# Patient Record
Sex: Male | Born: 1995 | Race: Black or African American | Hispanic: No | Marital: Single | Smoking: Never smoker
Health system: Southern US, Community
[De-identification: ages and names within clinical notes are randomized; demographics above are authoritative.]

## PROBLEM LIST (undated history)

## (undated) DIAGNOSIS — S82101A Unspecified fracture of upper end of right tibia, initial encounter for closed fracture: Secondary | ICD-10-CM

---
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
## 2016-04-19 ENCOUNTER — Emergency Department (HOSPITAL_COMMUNITY)
Admission: EM | Admit: 2016-04-19 | Discharge: 2016-04-20 | Disposition: A | Discharge status: Home / Self Care | Payer: BLUE CROSS/BLUE SHIELD | Attending: Emergency Medicine | Admitting: Emergency Medicine

## 2016-04-19 ENCOUNTER — Encounter (HOSPITAL_COMMUNITY): Payer: Self-pay

## 2016-04-19 DIAGNOSIS — R1084 Generalized abdominal pain: Secondary | ICD-10-CM | POA: Diagnosis present

## 2016-04-19 DIAGNOSIS — Z8781 Personal history of (healed) traumatic fracture: Secondary | ICD-10-CM | POA: Diagnosis not present

## 2016-04-19 DIAGNOSIS — K529 Noninfective gastroenteritis and colitis, unspecified: Secondary | ICD-10-CM

## 2016-04-19 DIAGNOSIS — J45909 Unspecified asthma, uncomplicated: Secondary | ICD-10-CM | POA: Insufficient documentation

## 2016-04-19 DIAGNOSIS — Z79899 Other long term (current) drug therapy: Secondary | ICD-10-CM | POA: Insufficient documentation

## 2016-04-19 DIAGNOSIS — R1031 Right lower quadrant pain: Secondary | ICD-10-CM

## 2016-04-19 LAB — URINALYSIS, ROUTINE W REFLEX MICROSCOPIC
Glucose, UA: NEGATIVE mg/dL
HGB URINE DIPSTICK: NEGATIVE
Leukocytes, UA: NEGATIVE
NITRITE: NEGATIVE
Protein, ur: NEGATIVE mg/dL
SPECIFIC GRAVITY, URINE: 1.031 — AB (ref 1.005–1.030)
pH: 6 (ref 5.0–8.0)

## 2016-04-19 LAB — CBC
HCT: 43.6 % (ref 39.0–52.0)
HEMOGLOBIN: 14.6 g/dL (ref 13.0–17.0)
MCH: 30.2 pg (ref 26.0–34.0)
MCHC: 33.5 g/dL (ref 30.0–36.0)
MCV: 90.3 fL (ref 78.0–100.0)
PLATELETS: 337 10*3/uL (ref 150–400)
RBC: 4.83 MIL/uL (ref 4.22–5.81)
RDW: 12.8 % (ref 11.5–15.5)
WBC: 9.4 10*3/uL (ref 4.0–10.5)

## 2016-04-19 LAB — COMPREHENSIVE METABOLIC PANEL
ALBUMIN: 4.5 g/dL (ref 3.5–5.0)
ALK PHOS: 53 U/L (ref 38–126)
ALT: 21 U/L (ref 17–63)
ANION GAP: 14 (ref 5–15)
AST: 19 U/L (ref 15–41)
BILIRUBIN TOTAL: 1.1 mg/dL (ref 0.3–1.2)
BUN: 14 mg/dL (ref 6–20)
CALCIUM: 9.8 mg/dL (ref 8.9–10.3)
CO2: 20 mmol/L — AB (ref 22–32)
CREATININE: 0.93 mg/dL (ref 0.61–1.24)
Chloride: 104 mmol/L (ref 101–111)
GFR calc Af Amer: >60 mL/min (ref >60)
GFR calc non Af Amer: >60 mL/min (ref >60)
GLUCOSE: 130 mg/dL — AB (ref 65–99)
Potassium: 3.4 mmol/L — ABNORMAL LOW (ref 3.5–5.1)
SODIUM: 138 mmol/L (ref 135–145)
TOTAL PROTEIN: 8.3 g/dL — AB (ref 6.5–8.1)

## 2016-04-19 LAB — LIPASE, BLOOD: Lipase: 22 U/L (ref 11–51)

## 2016-04-19 MED ORDER — ONDANSETRON HCL 4 MG/2ML IJ SOLN
4.0000 mg | Freq: Once | INTRAMUSCULAR | Status: AC
  Administered 2016-04-20: 4 mg via INTRAVENOUS
  Filled 2016-04-19: qty 2

## 2016-04-19 MED ORDER — ACETAMINOPHEN 325 MG PO TABS
650.0000 mg | ORAL_TABLET | Freq: Once | ORAL | Status: AC | PRN
  Administered 2016-04-19: 650 mg via ORAL

## 2016-04-19 MED ORDER — MORPHINE SULFATE (PF) 4 MG/ML IV SOLN
4.0000 mg | Freq: Once | INTRAVENOUS | Status: AC
  Administered 2016-04-20: 4 mg via INTRAVENOUS
  Filled 2016-04-19: qty 1

## 2016-04-19 MED ORDER — ACETAMINOPHEN 325 MG PO TABS
ORAL_TABLET | ORAL | Status: AC
  Filled 2016-04-19: qty 2

## 2016-04-19 NOTE — ED Provider Notes (Signed)
CSN: [id number]     Arrival date & time 04/19/16  2209 History   First MD Initiated Contact with Patient 04/19/16 2314     Chief Complaint  Patient presents with  . Abdominal Pain     (Consider location/radiation/quality/duration/timing/severity/associated sxs/prior Treatment) HPI Comments: Patient presents today with a chief complaint of abdominal pain.  Pain has been constant since this morning.  Pain is diffuse and does not radiate.  He has not taken anything for pain prior to arrival.  He states that he never had pain like this before.  He does have associated nausea and had two episodes of loose stool after arrival in the ED.  He also was found to have a temp of 100.5 F upon arrival in the ED.  He denies vomiting, urinary symptoms, scrotal pain/swelling, or any other symptoms at this time.  No prior abdominal surgeries.    The history is provided by the patient.    Past Medical History  Diagnosis Date  . Asthma   . Closed fracture of right proximal tibia 12/18/2011   Past Surgical History  Procedure Laterality Date  . Orif tibia plateau  12/18/2011    Procedure: OPEN REDUCTION INTERNAL FIXATION (ORIF) TIBIAL PLATEAU;  Surgeon: Eulas PostJoshua P Landau, MD;  Location: [facility] OR;  Service: Orthopedics;  Laterality: Right;  ORIF Right Proximal Tibia Fracture  . Irrigation and debridement knee  12/18/2011    Procedure: IRRIGATION AND DEBRIDEMENT KNEE;  Surgeon: Eulas PostJoshua P Landau, MD;  Location: [facility] OR;  Service: Orthopedics;  Laterality: Right;   Family History  Problem Relation Age of Onset  . Diabetes Mother   . Hyperlipidemia Father   . Hypertension Father    Social History  Substance Use Topics  . Smoking status: Never Smoker   . Smokeless tobacco: Never Used  . Alcohol Use: No    Review of Systems  All other systems reviewed and are negative.     Allergies  Review of patient's allergies indicates no known allergies.  Home Medications   Prior to Admission medications    Medication Sig Start Date End Date Taking? Authorizing Provider  Multiple Vitamin (MULTIVITAMIN WITH MINERALS) TABS tablet Take 1 tablet by mouth daily.   Yes Historical Provider, MD   BP 113/65 mmHg  Pulse 96  Temp(Src) 100.5 F (38.1 C) (Oral)  Resp 18  SpO2 100% Physical Exam  Constitutional: He appears well-developed and well-nourished.  HENT:  Head: Normocephalic and atraumatic.  Mouth/Throat: Oropharynx is clear and moist.  Neck: Normal range of motion. Neck supple.  Cardiovascular: Normal rate, regular rhythm and normal heart sounds.   Pulmonary/Chest: Effort normal and breath sounds normal.  Abdominal: Soft. Bowel sounds are normal. He exhibits no distension and no mass. There is tenderness in the right lower quadrant. There is no rebound and no guarding.  Musculoskeletal: Normal range of motion.  Neurological: He is alert.  Skin: Skin is warm and dry.  Psychiatric: He has a normal mood and affect.  Nursing note and vitals reviewed.   ED Course  Procedures (including critical care time) Labs Review Labs Reviewed  COMPREHENSIVE METABOLIC PANEL - Abnormal; Notable for the following:    Potassium 3.4 (*)    CO2 20 (*)    Glucose, Bld 130 (*)    Total Protein 8.3 (*)    All other components within normal limits  URINALYSIS, ROUTINE W REFLEX MICROSCOPIC (NOT AT [facility]) - Abnormal; Notable for the following:    Specific Gravity, Urine 1.031 (*)  Bilirubin Urine SMALL (*)    Ketones, ur >80 (*)    All other components within normal limits  LIPASE, BLOOD  CBC    Imaging Review No results found. I have personally reviewed and evaluated these images and lab results as part of my medical decision-making.   EKG Interpretation None      MDM   Final diagnoses:  RLQ abdominal pain   Patient presents today with low grade fever and diffuse abdominal pain.  On exam, he has tenderness to palpation of the RLQ.  Labs today are unremarkable.  However, due to the fact  that he has a fever and pain of the RLQ, a CT ab/pelvis was ordered to rule out an Appendicitis.  Patient signed out to Marlon Pel, PA-C at shift change who will follow up on the results of the CT.    Santiago Glad, PA-C 04/21/16 2217  Layla Maw Ward, DO 04/21/16 2310

## 2016-04-19 NOTE — ED Notes (Signed)
Pt states he has had generalized abdominal pain and nausea all day and reports his abdomen feels hard and it hurts to move. LBM yesterday, usually has bowel movement after every meal. Denies urinary abnormalities.

## 2016-04-20 ENCOUNTER — Emergency Department (HOSPITAL_COMMUNITY): Payer: BLUE CROSS/BLUE SHIELD

## 2016-04-20 MED ORDER — IOPAMIDOL (ISOVUE-300) INJECTION 61%
INTRAVENOUS | Status: AC
  Administered 2016-04-20: 100 mL
  Filled 2016-04-20: qty 100

## 2016-04-20 MED ORDER — METRONIDAZOLE 500 MG PO TABS: 500.0000 mg | ORAL_TABLET | Freq: Two times a day (BID) | ORAL | Status: AC

## 2016-04-20 MED ORDER — CIPROFLOXACIN HCL 500 MG PO TABS: 500.0000 mg | ORAL_TABLET | Freq: Two times a day (BID) | ORAL | Status: AC

## 2016-04-20 MED ORDER — HYDROCODONE-ACETAMINOPHEN 5-325 MG PO TABS
1.0000 | ORAL_TABLET | Freq: Once | ORAL | Status: AC
  Administered 2016-04-20: 1 via ORAL
  Filled 2016-04-20: qty 1

## 2016-04-20 MED ORDER — TRAMADOL HCL 50 MG PO TABS: 50.0000 mg | ORAL_TABLET | Freq: Four times a day (QID) | ORAL | Status: AC | PRN

## 2016-04-20 MED ORDER — METRONIDAZOLE 500 MG PO TABS
500.0000 mg | ORAL_TABLET | Freq: Once | ORAL | Status: AC
  Administered 2016-04-20: 500 mg via ORAL
  Filled 2016-04-20: qty 1

## 2016-04-20 MED ORDER — CIPROFLOXACIN HCL 500 MG PO TABS
500.0000 mg | ORAL_TABLET | Freq: Once | ORAL | Status: AC
  Administered 2016-04-20: 500 mg via ORAL
  Filled 2016-04-20: qty 1

## 2016-04-20 MED ORDER — ONDANSETRON HCL 4 MG PO TABS: 4.0000 mg | ORAL_TABLET | Freq: Four times a day (QID) | ORAL | Status: AC

## 2016-04-20 NOTE — ED Notes (Signed)
Patient transported to CT 

## 2016-04-20 NOTE — Discharge Instructions (Signed)

## 2016-04-20 NOTE — ED Provider Notes (Signed)
Patient sign out from Santiago GladHeather Laisure, PA-C at end ofshift.  Patient having abdominal pain, RLQ and nausea and low grade fever. CT abd/pelv pending.  CT scan shows signs of enteritis. Given patient low grade fever and severity of pain will start on empiric abx.  Cipro/flagyl regimine and Zofran for home. discussed results with patient and answered questions  Medications  acetaminophen (TYLENOL) tablet 650 mg (650 mg Oral Given 04/19/16 2226)  morphine 4 MG/ML injection 4 mg (4 mg Intravenous Given 04/20/16 0028)  ondansetron (ZOFRAN) injection 4 mg (4 mg Intravenous Given 04/20/16 0028)  iopamidol (ISOVUE-300) 61 % injection (100 mLs  Contrast Given 04/20/16 0057)    I discussed results, diagnoses and plan with [place] S Schmieg. They voice there understanding and questions were answered. We discussed follow-up recommendations and return precautions.   Marlon Peliffany Curry Seefeldt, PA-C 04/20/16 0154  Layla MawKristen N Ward, DO 04/20/16 [id number]

## 2017-04-12 ENCOUNTER — Emergency Department (HOSPITAL_COMMUNITY): Payer: No Typology Code available for payment source

## 2017-04-12 ENCOUNTER — Emergency Department (HOSPITAL_COMMUNITY)
Admission: EM | Admit: 2017-04-12 | Discharge: 2017-04-12 | Disposition: A | Discharge status: Home / Self Care | Payer: No Typology Code available for payment source | Attending: Emergency Medicine | Admitting: Emergency Medicine

## 2017-04-12 ENCOUNTER — Encounter (HOSPITAL_COMMUNITY): Payer: Self-pay | Admitting: *Deleted

## 2017-04-12 DIAGNOSIS — Y939 Activity, unspecified: Secondary | ICD-10-CM | POA: Diagnosis not present

## 2017-04-12 DIAGNOSIS — S60512A Abrasion of left hand, initial encounter: Secondary | ICD-10-CM

## 2017-04-12 DIAGNOSIS — Y999 Unspecified external cause status: Secondary | ICD-10-CM | POA: Insufficient documentation

## 2017-04-12 DIAGNOSIS — J45909 Unspecified asthma, uncomplicated: Secondary | ICD-10-CM | POA: Insufficient documentation

## 2017-04-12 DIAGNOSIS — Y92410 Unspecified street and highway as the place of occurrence of the external cause: Secondary | ICD-10-CM | POA: Diagnosis not present

## 2017-04-12 DIAGNOSIS — S40022A Contusion of left upper arm, initial encounter: Secondary | ICD-10-CM | POA: Diagnosis not present

## 2017-04-12 DIAGNOSIS — S301XXA Contusion of abdominal wall, initial encounter: Secondary | ICD-10-CM | POA: Diagnosis not present

## 2017-04-12 DIAGNOSIS — Z79899 Other long term (current) drug therapy: Secondary | ICD-10-CM | POA: Insufficient documentation

## 2017-04-12 DIAGNOSIS — S3991XA Unspecified injury of abdomen, initial encounter: Secondary | ICD-10-CM | POA: Diagnosis present

## 2017-04-12 MED ORDER — HYDROCODONE-ACETAMINOPHEN 5-325 MG PO TABS
1.0000 | ORAL_TABLET | Freq: Once | ORAL | Status: AC
  Administered 2017-04-12: 1 via ORAL
  Filled 2017-04-12: qty 1

## 2017-04-12 MED ORDER — IBUPROFEN 600 MG PO TABS: 600.0000 mg | ORAL_TABLET | Freq: Four times a day (QID) | ORAL | 0 refills | Status: AC | PRN

## 2017-04-12 MED ORDER — BACITRACIN ZINC 500 UNIT/GM EX OINT
TOPICAL_OINTMENT | Freq: Two times a day (BID) | CUTANEOUS | Status: DC
Start: 2017-04-12 — End: 2017-04-12
  Administered 2017-04-12: 1 via TOPICAL
  Filled 2017-04-12: qty 1.8

## 2017-04-12 MED ORDER — HYDROCODONE-ACETAMINOPHEN 5-325 MG PO TABS: 1.0000 | ORAL_TABLET | ORAL | 0 refills | Status: AC | PRN

## 2017-04-12 MED ORDER — IOPAMIDOL (ISOVUE-300) INJECTION 61%
100.0000 mL | Freq: Once | INTRAVENOUS | Status: AC | PRN
  Administered 2017-04-12: 100 mL via INTRAVENOUS

## 2017-04-12 MED ORDER — IBUPROFEN 200 MG PO TABS
600.0000 mg | ORAL_TABLET | Freq: Once | ORAL | Status: AC
  Administered 2017-04-12: 600 mg via ORAL
  Filled 2017-04-12: qty 3

## 2017-04-12 MED ORDER — IOPAMIDOL (ISOVUE-300) INJECTION 61%
INTRAVENOUS | Status: AC
  Filled 2017-04-12: qty 100

## 2017-04-12 NOTE — ED Notes (Signed)
Bed: WA05 Expected date:  Expected time:  Means of arrival:  Comments: 

## 2017-04-12 NOTE — ED Notes (Signed)
Dr. Haviland at bedside. 
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
## 2020-05-12 ENCOUNTER — Other Ambulatory Visit (HOSPITAL_COMMUNITY): Payer: Self-pay | Admitting: Internal Medicine

## 2020-10-02 ENCOUNTER — Ambulatory Visit: Payer: PRIVATE HEALTH INSURANCE | Attending: Internal Medicine

## 2020-10-02 DIAGNOSIS — Z23 Encounter for immunization: Secondary | ICD-10-CM

## 2020-10-02 NOTE — Progress Notes (Signed)
   Covid-19 Vaccination Clinic  Name:  Brian [name]    MRN: [id number] DOB: 1996-10-27  10/02/2020  [name] was observed post Covid-19 immunization for 15 minutes without incident. He was provided with Vaccine Information Sheet and instruction to access the V-Safe system.   [name] was instructed to call 911 with any severe reactions post vaccine: Marland Kitchen Difficulty breathing  . Swelling of face and throat  . A fast heartbeat  . A bad rash all over body  . Dizziness and weakness

## 2021-04-02 ENCOUNTER — Other Ambulatory Visit (HOSPITAL_COMMUNITY): Payer: Self-pay

## 2021-04-02 MED FILL — Rosuvastatin Calcium Tab 10 MG: ORAL | 90 days supply | Qty: 90 | Fill #0 | Status: AC

## 2021-05-30 ENCOUNTER — Other Ambulatory Visit (HOSPITAL_COMMUNITY): Payer: Self-pay

## 2021-05-31 ENCOUNTER — Other Ambulatory Visit (HOSPITAL_COMMUNITY): Payer: Self-pay

## 2021-05-31 MED ORDER — ROSUVASTATIN CALCIUM 10 MG PO TABS
ORAL_TABLET | ORAL | 1 refills | Status: DC
  Filled 2021-05-31 – 2021-09-13 (×4): qty 90, 90d supply, fill #0

## 2021-06-06 ENCOUNTER — Other Ambulatory Visit (HOSPITAL_COMMUNITY): Payer: Self-pay

## 2021-06-07 ENCOUNTER — Other Ambulatory Visit (HOSPITAL_COMMUNITY): Payer: Self-pay

## 2021-08-31 ENCOUNTER — Other Ambulatory Visit (HOSPITAL_COMMUNITY): Payer: Self-pay

## 2021-09-03 ENCOUNTER — Other Ambulatory Visit (HOSPITAL_COMMUNITY): Payer: Self-pay

## 2021-09-05 ENCOUNTER — Other Ambulatory Visit (HOSPITAL_COMMUNITY): Payer: Self-pay

## 2021-09-13 ENCOUNTER — Other Ambulatory Visit (HOSPITAL_COMMUNITY): Payer: Self-pay

## 2022-02-25 ENCOUNTER — Other Ambulatory Visit (HOSPITAL_COMMUNITY): Payer: Self-pay

## 2022-02-25 MED ORDER — ROSUVASTATIN CALCIUM 20 MG PO TABS
20.0000 mg | ORAL_TABLET | Freq: Every day | ORAL | 0 refills | Status: DC
  Filled 2022-02-25 – 2022-03-05 (×2): qty 90, 90d supply, fill #0

## 2022-03-05 ENCOUNTER — Other Ambulatory Visit (HOSPITAL_COMMUNITY): Payer: Self-pay

## 2022-05-27 ENCOUNTER — Other Ambulatory Visit (HOSPITAL_COMMUNITY): Payer: Self-pay

## 2022-05-27 MED ORDER — ROSUVASTATIN CALCIUM 20 MG PO TABS
20.0000 mg | ORAL_TABLET | Freq: Every day | ORAL | 0 refills | Status: DC
  Filled 2022-05-27: qty 90, 90d supply, fill #0

## 2022-05-29 ENCOUNTER — Other Ambulatory Visit (HOSPITAL_COMMUNITY): Payer: Self-pay

## 2022-07-18 ENCOUNTER — Other Ambulatory Visit (HOSPITAL_COMMUNITY): Payer: Self-pay

## 2022-12-20 ENCOUNTER — Other Ambulatory Visit: Payer: Self-pay

## 2022-12-20 MED ORDER — ROSUVASTATIN CALCIUM 20 MG PO TABS
20.0000 mg | ORAL_TABLET | Freq: Every day | ORAL | 0 refills | Status: DC
  Filled 2022-12-20 – 2023-01-01 (×2): qty 90, 90d supply, fill #0

## 2022-12-27 ENCOUNTER — Other Ambulatory Visit: Payer: Self-pay

## 2023-01-01 ENCOUNTER — Other Ambulatory Visit: Payer: Self-pay

## 2023-03-25 ENCOUNTER — Other Ambulatory Visit: Payer: Self-pay

## 2023-03-26 ENCOUNTER — Other Ambulatory Visit: Payer: Self-pay

## 2023-03-26 MED ORDER — ROSUVASTATIN CALCIUM 20 MG PO TABS
20.0000 mg | ORAL_TABLET | Freq: Every day | ORAL | 2 refills | Status: AC
  Filled 2023-03-26: qty 90, 90d supply, fill #0
  Filled 2023-09-02: qty 90, 90d supply, fill #1

## 2023-03-31 ENCOUNTER — Other Ambulatory Visit: Payer: Self-pay

## 2023-09-02 ENCOUNTER — Other Ambulatory Visit: Payer: Self-pay

## 2023-09-09 ENCOUNTER — Other Ambulatory Visit: Payer: Self-pay

## 2023-12-24 ENCOUNTER — Other Ambulatory Visit (HOSPITAL_COMMUNITY): Payer: Self-pay

## 2023-12-24 MED ORDER — ROSUVASTATIN CALCIUM 10 MG PO TABS
10.0000 mg | ORAL_TABLET | Freq: Two times a day (BID) | ORAL | 3 refills | Status: AC
  Filled 2023-12-24: qty 180, 90d supply, fill #0
  Filled 2024-10-04: qty 60, 30d supply, fill #0

## 2023-12-26 ENCOUNTER — Other Ambulatory Visit (HOSPITAL_COMMUNITY): Payer: Self-pay

## 2024-01-01 ENCOUNTER — Other Ambulatory Visit (HOSPITAL_COMMUNITY): Payer: Self-pay

## 2024-04-02 ENCOUNTER — Other Ambulatory Visit (HOSPITAL_COMMUNITY): Payer: Self-pay

## 2024-04-02 MED ORDER — ATORVASTATIN CALCIUM 10 MG PO TABS
10.0000 mg | ORAL_TABLET | Freq: Every day | ORAL | 11 refills | Status: AC
  Filled 2024-04-02: qty 30, 30d supply, fill #0
  Filled 2024-04-30: qty 30, 30d supply, fill #1
  Filled 2024-06-04: qty 30, 30d supply, fill #2
  Filled 2024-07-07: qty 30, 30d supply, fill #3
  Filled 2024-08-02: qty 30, 30d supply, fill #4
  Filled 2024-09-13: qty 30, 30d supply, fill #5
  Filled 2024-10-10: qty 30, 30d supply, fill #6

## 2024-06-28 ENCOUNTER — Other Ambulatory Visit (HOSPITAL_COMMUNITY): Payer: Self-pay

## 2024-06-28 MED ORDER — VITAMIN D3 1.25 MG (50000 UT) PO CAPS
50000.0000 [IU] | ORAL_CAPSULE | ORAL | 0 refills | Status: AC
  Filled 2024-06-28: qty 12, 84d supply, fill #0

## 2024-07-26 ENCOUNTER — Other Ambulatory Visit (HOSPITAL_COMMUNITY): Payer: Self-pay

## 2024-07-26 MED ORDER — PHENTERMINE HCL 37.5 MG PO TABS
18.7500 mg | ORAL_TABLET | Freq: Every morning | ORAL | 0 refills | Status: DC
  Filled 2024-07-26: qty 30, 30d supply, fill #0

## 2024-07-28 ENCOUNTER — Other Ambulatory Visit (HOSPITAL_COMMUNITY): Payer: Self-pay

## 2024-08-24 ENCOUNTER — Other Ambulatory Visit (HOSPITAL_COMMUNITY): Payer: Self-pay

## 2024-08-24 MED ORDER — PHENTERMINE HCL 37.5 MG PO TABS
18.7500 mg | ORAL_TABLET | Freq: Every day | ORAL | 0 refills | Status: DC
  Filled 2024-08-24 – 2024-08-25 (×2): qty 30, 30d supply, fill #0

## 2024-08-25 ENCOUNTER — Other Ambulatory Visit (HOSPITAL_COMMUNITY): Payer: Self-pay

## 2024-09-13 ENCOUNTER — Other Ambulatory Visit (HOSPITAL_COMMUNITY): Payer: Self-pay

## 2024-09-13 ENCOUNTER — Other Ambulatory Visit: Payer: Self-pay

## 2024-09-15 ENCOUNTER — Other Ambulatory Visit (HOSPITAL_COMMUNITY): Payer: Self-pay

## 2024-09-15 MED ORDER — PHENTERMINE HCL 37.5 MG PO TABS
17.7500 mg | ORAL_TABLET | Freq: Every day | ORAL | 0 refills | Status: AC
  Filled 2024-10-04: qty 30, 30d supply, fill #0

## 2024-10-04 ENCOUNTER — Other Ambulatory Visit (HOSPITAL_COMMUNITY): Payer: Self-pay

## 2024-10-05 ENCOUNTER — Other Ambulatory Visit (HOSPITAL_COMMUNITY): Payer: Self-pay

## 2024-11-05 ENCOUNTER — Encounter (HOSPITAL_COMMUNITY): Payer: Self-pay

## 2024-11-05 ENCOUNTER — Emergency Department (HOSPITAL_COMMUNITY)

## 2024-11-05 ENCOUNTER — Other Ambulatory Visit: Payer: Self-pay

## 2024-11-05 ENCOUNTER — Emergency Department (HOSPITAL_COMMUNITY)
Admission: EM | Admit: 2024-11-05 | Discharge: 2024-11-06 | Disposition: A | Attending: Emergency Medicine | Admitting: Emergency Medicine

## 2024-11-05 DIAGNOSIS — R519 Headache, unspecified: Secondary | ICD-10-CM | POA: Diagnosis present

## 2024-11-05 DIAGNOSIS — R29898 Other symptoms and signs involving the musculoskeletal system: Secondary | ICD-10-CM | POA: Insufficient documentation

## 2024-11-05 DIAGNOSIS — R791 Abnormal coagulation profile: Secondary | ICD-10-CM | POA: Insufficient documentation

## 2024-11-05 DIAGNOSIS — R799 Abnormal finding of blood chemistry, unspecified: Secondary | ICD-10-CM | POA: Insufficient documentation

## 2024-11-05 DIAGNOSIS — G43809 Other migraine, not intractable, without status migrainosus: Secondary | ICD-10-CM | POA: Insufficient documentation

## 2024-11-05 DIAGNOSIS — R449 Unspecified symptoms and signs involving general sensations and perceptions: Secondary | ICD-10-CM | POA: Insufficient documentation

## 2024-11-05 LAB — COMPREHENSIVE METABOLIC PANEL WITH GFR
ALT: 23 U/L (ref 0–44)
AST: 17 U/L (ref 15–41)
Albumin: 4.3 g/dL (ref 3.5–5.0)
Alkaline Phosphatase: 43 U/L (ref 38–126)
Anion gap: 12 (ref 5–15)
BUN: 11 mg/dL (ref 6–20)
CO2: 22 mmol/L (ref 22–32)
Calcium: 9.3 mg/dL (ref 8.9–10.3)
Chloride: 104 mmol/L (ref 98–111)
Creatinine, Ser: 0.89 mg/dL (ref 0.61–1.24)
GFR, Estimated: >60 mL/min (ref >60)
Glucose, Bld: 102 mg/dL — ABNORMAL HIGH (ref 70–99)
Potassium: 3.6 mmol/L (ref 3.5–5.1)
Sodium: 138 mmol/L (ref 135–145)
Total Bilirubin: 1 mg/dL (ref 0.0–1.2)
Total Protein: 7.8 g/dL (ref 6.5–8.1)

## 2024-11-05 LAB — DIFFERENTIAL
Abs Immature Granulocytes: 0.03 K/uL (ref 0.00–0.07)
Basophils Absolute: 0 K/uL (ref 0.0–0.1)
Basophils Relative: 0 %
Eosinophils Absolute: 0 K/uL (ref 0.0–0.5)
Eosinophils Relative: 0 %
Immature Granulocytes: 0 %
Lymphocytes Relative: 6 %
Lymphs Abs: 0.5 K/uL — ABNORMAL LOW (ref 0.7–4.0)
Monocytes Absolute: 1 K/uL (ref 0.1–1.0)
Monocytes Relative: 10 %
Neutro Abs: 7.7 K/uL (ref 1.7–7.7)
Neutrophils Relative %: 84 %

## 2024-11-05 LAB — I-STAT CHEM 8, ED
BUN: 12 mg/dL (ref 6–20)
Calcium, Ion: 1.07 mmol/L — ABNORMAL LOW (ref 1.15–1.40)
Chloride: 105 mmol/L (ref 98–111)
Creatinine, Ser: 0.9 mg/dL (ref 0.61–1.24)
Glucose, Bld: 102 mg/dL — ABNORMAL HIGH (ref 70–99)
HCT: 45 % (ref 39.0–52.0)
Hemoglobin: 15.3 g/dL (ref 13.0–17.0)
Potassium: 3.6 mmol/L (ref 3.5–5.1)
Sodium: 139 mmol/L (ref 135–145)
TCO2: 21 mmol/L — ABNORMAL LOW (ref 22–32)

## 2024-11-05 LAB — CBC
HCT: 43.3 % (ref 39.0–52.0)
Hemoglobin: 14.2 g/dL (ref 13.0–17.0)
MCH: 30.3 pg (ref 26.0–34.0)
MCHC: 32.8 g/dL (ref 30.0–36.0)
MCV: 92.5 fL (ref 80.0–100.0)
Platelets: 376 K/uL (ref 150–400)
RBC: 4.68 MIL/uL (ref 4.22–5.81)
RDW: 13.5 % (ref 11.5–15.5)
WBC: 9.3 K/uL (ref 4.0–10.5)
nRBC: 0 % (ref 0.0–0.2)

## 2024-11-05 LAB — PROTIME-INR
INR: 1 (ref 0.8–1.2)
Prothrombin Time: 14.1 s (ref 11.4–15.2)

## 2024-11-05 LAB — ETHANOL: Alcohol, Ethyl (B): <15 mg/dL (ref <15)

## 2024-11-05 LAB — CBG MONITORING, ED: Glucose-Capillary: 90 mg/dL (ref 70–99)

## 2024-11-05 LAB — APTT: aPTT: 29 s (ref 24–36)

## 2024-11-05 MED ORDER — PROCHLORPERAZINE EDISYLATE 10 MG/2ML IJ SOLN
10.0000 mg | Freq: Once | INTRAMUSCULAR | Status: AC
  Administered 2024-11-05: 10 mg via INTRAVENOUS
  Filled 2024-11-05: qty 2

## 2024-11-05 MED ORDER — DIPHENHYDRAMINE HCL 50 MG/ML IJ SOLN
50.0000 mg | Freq: Once | INTRAMUSCULAR | Status: AC
  Administered 2024-11-05: 50 mg via INTRAVENOUS
  Filled 2024-11-05: qty 1

## 2024-11-05 MED ORDER — IOHEXOL 350 MG/ML SOLN
100.0000 mL | Freq: Once | INTRAVENOUS | Status: AC | PRN
  Administered 2024-11-05: 100 mL via INTRAVENOUS

## 2024-11-05 MED ORDER — SODIUM CHLORIDE 0.9% FLUSH
3.0000 mL | Freq: Once | INTRAVENOUS | Status: AC
  Administered 2024-11-05: 3 mL via INTRAVENOUS

## 2024-11-05 MED ORDER — LORAZEPAM 1 MG PO TABS
1.0000 mg | ORAL_TABLET | Freq: Once | ORAL | Status: DC
  Filled 2024-11-05: qty 1

## 2024-11-05 MED ORDER — LACTATED RINGERS IV BOLUS
1000.0000 mL | Freq: Once | INTRAVENOUS | Status: AC
  Administered 2024-11-05: 1000 mL via INTRAVENOUS

## 2024-11-05 NOTE — Code Documentation (Signed)
 Responded to Code Stroke called at 2009 for L sided arm/leg weakness and facial droop, LSN-1100. Pt arrived at 2014, CBG-90, NIH-13, CT head negative for acute changes. NIH improved to 8 while in CT. CTA/CTP-no LVO.  TNK not given-outside side. Plan: migraine cocktail. Please complete VS/neuro checks(including NIH) q2h x 12h, then q4h.

## 2024-11-05 NOTE — Consult Note (Incomplete)
 NEUROLOGY CONSULT NOTE   Date of service: November 05, 2024 Patient Name: Lupe S Radler MRN:  [id number] DOB:  06/10/1996 Chief Complaint: Left sided weakness Requesting Provider: Lenor Hollering, MD  History of Present Illness  Abie S Kessenich is a 28 y.o. male with a PMHx of asthma who presents to the ED after experiencing acute onset of left sided weakness in the context of an ongoing left sided throbbing headache that began about 2 weeks ago when he was subject to significant emotional stress after a close friend experienced serious health issues requiring surgery. LKN was initially reported as 1925, then revealed on further interview with patient to have been sometime between 9 AM and 1 PM today. Initial symptom was headache and mild left sided weakness. At about 7:20 PM he experienced abrupt worsening of his left sided weakness, so he texted a male friend about it. EMS was then called. BP was 140/100 and CBG was 90 with EMS.  LKW: Between 9 AM and 1 PM today Modified rankin score: 0 IV Thrombolysis: No: Outside of the time window EVT: No: CTA negative for LVO   NIHSS components Score: Comment  1a Level of Conscious 0[x]  1[]  2[]  3[]      1b LOC Questions 0[x]  1[]  2[]       1c LOC Commands 0[x]  1[]  2[]       2 Best Gaze 0[x]  1[]  2[]       3 Visual 0[]  1[x]  2[]  3[]     Inconsistent, patchy and non-homonymous apparent visual field deficits  4 Facial Palsy 0[x]  1[]  2[]  3[]     No clear facial droop. Contracts left side of face slightly at baseline when apparently at rest. Smile and cheek puff are weak bilaterally with alternating sides contracting the most after several trials at different time points during the exam. No clear localizable weakness.   5a Motor Arm - left 0[]  1[]  2[x]  3[]  4[]  UN[]   Will drop immediately when passively elevated, but remains elevated for 1-2 seconds prior to abruptly giving way, when the arm is given tactile stimulation during elevation in conjunction with  coaching.   5b Motor Arm - Right 0[x]  1[]  2[]  3[]  4[]  UN[]    6a Motor Leg - Left 0[]  1[]  2[x]  3[]  4[]  UN[]   Will drop immediately when passively elevated, but remains elevated for 1-2 seconds prior to abruptly giving way, when the leg is given tactile stimulation during elevation in conjunction with coaching. Alternating flaccid versus rigid tone that both wax and wane and are distractible.   6b Motor Leg - Right 0[x]  1[]  2[]  3[]  4[]  UN[]   Intermittent rigidity in flexion and extension that subsides when asked to relax leg muscles.   7 Limb Ataxia 0[x]  1[]  2[]  UN[]     Negative on right, unable to perform on left.   8 Sensory 0[]  1[]  2[x]  UN[]     Endorses being completely insensate on left, but withdraws left foot a few inches with noxious plantar stimulation.   9 Best Language 0[x]  1[]  2[]  3[]     Slow, laborious speech has a nonphysiological quality  10 Dysarthria 0[x]  1[]  2[]  UN[]     Speech slow, but cadence varies.   11 Extinct. and Inattention 0[]  1[x]  2[]      Extinction in left visual hemifield to DSS.   TOTAL:  8      ROS  Comprehensive ROS performed and pertinent positives documented in HPI    Past History   Past Medical History:  Diagnosis Date   Asthma  Closed fracture of right proximal tibia 12/18/2011    Past Surgical History:  Procedure Laterality Date   IRRIGATION AND DEBRIDEMENT KNEE  12/18/2011   Procedure: IRRIGATION AND DEBRIDEMENT KNEE;  Surgeon: Fonda SHAUNNA Olmsted, MD;  Location: [facility] OR;  Service: Orthopedics;  Laterality: Right;   ORIF TIBIA PLATEAU  12/18/2011   Procedure: OPEN REDUCTION INTERNAL FIXATION (ORIF) TIBIAL PLATEAU;  Surgeon: Fonda SHAUNNA Olmsted, MD;  Location: [facility] OR;  Service: Orthopedics;  Laterality: Right;  ORIF Right Proximal Tibia Fracture    Family History: Family History  Problem Relation Age of Onset   Diabetes Mother    Hyperlipidemia Father    Hypertension Father     Social History  reports that he has never smoked. He has never used  smokeless tobacco. He reports that he does not drink alcohol and does not use drugs.  No Known Allergies  Medications   Current Facility-Administered Medications:    sodium chloride  flush (NS) 0.9 % injection 3 mL, 3 mL, Intravenous, Once, Lenor Hollering, MD  Current Outpatient Medications:    atorvastatin  (LIPITOR) 10 MG tablet, Take 1 tablet (10 mg total) by mouth daily., Disp: 30 tablet, Rfl: 11   cetirizine (ZYRTEC) 10 MG tablet, Take 10 mg by mouth daily., Disp: , Rfl:    Cholecalciferol  (VITAMIN D3) 1.25 MG (50000 UT) CAPS, Take 1 capsule (50,000 Units total) by mouth once a week., Disp: 12 capsule, Rfl: 0   ciprofloxacin  (CIPRO ) 500 MG tablet, Take 1 tablet (500 mg total) by mouth 2 (two) times daily. (Patient not taking: Reported on 04/12/2017), Disp: 14 tablet, Rfl: 0   HYDROcodone -acetaminophen  (NORCO/VICODIN) 5-325 MG tablet, Take 1 tablet by mouth every 4 (four) hours as needed., Disp: 10 tablet, Rfl: 0   ibuprofen  (ADVIL ,MOTRIN ) 200 MG tablet, Take 400 mg by mouth every 6 (six) hours as needed for mild pain or moderate pain., Disp: , Rfl:    ibuprofen  (ADVIL ,MOTRIN ) 600 MG tablet, Take 1 tablet (600 mg total) by mouth every 6 (six) hours as needed., Disp: 30 tablet, Rfl: 0   metroNIDAZOLE  (FLAGYL ) 500 MG tablet, Take 1 tablet (500 mg total) by mouth 2 (two) times daily. (Patient not taking: Reported on 04/12/2017), Disp: 14 tablet, Rfl: 0   Multiple Vitamin (MULTIVITAMIN WITH MINERALS) TABS tablet, Take 1 tablet by mouth daily., Disp: , Rfl:    ondansetron  (ZOFRAN ) 4 MG tablet, Take 1 tablet (4 mg total) by mouth every 6 (six) hours. (Patient not taking: Reported on 04/12/2017), Disp: 12 tablet, Rfl: 0   phentermine  (ADIPEX-P ) 37.5 MG tablet, Take 0.5-1 tablets (18.75-37.5 mg total) by mouth daily before breakfast., Disp: 30 tablet, Rfl: 0   rosuvastatin  (CRESTOR ) 10 MG tablet, TAKE 1 TABLET BY MOUTH ONCE A DAY, Disp: 90 tablet, Rfl: 3   rosuvastatin  (CRESTOR ) 10 MG tablet, Take 1  tablet (10 mg total) by mouth in the morning and at bedtime., Disp: 180 tablet, Rfl: 3   rosuvastatin  (CRESTOR ) 20 MG tablet, Take 1 tablet (20 mg total) by mouth daily., Disp: 90 tablet, Rfl: 2   traMADol  (ULTRAM ) 50 MG tablet, Take 1 tablet (50 mg total) by mouth every 6 (six) hours as needed. (Patient not taking: Reported on 04/12/2017), Disp: 15 tablet, Rfl: 0  Vitals   Vitals:   11/05/24 2000  Weight: 77.2 kg    Body mass index is 27.06 kg/m.   Physical Exam   Constitutional: Appears well-developed and well-nourished.  Psych: Blunted affect may be functional in quality Eyes: No scleral  injection.  HENT: No OP obstruction.  Head: Normocephalic.  Respiratory: Effort normal, non-labored breathing.  Skin: WDI.  Ext: No edema  Neurologic Examination   See NIHSS  Labs/Imaging/Neurodiagnostic studies   CBC:  Recent Labs  Lab 2024/11/25 2016 11/25/24 2023  WBC 9.3  --   NEUTROABS 7.7  --   HGB 14.2 15.3  HCT 43.3 45.0  MCV 92.5  --   PLT 376  --    Basic Metabolic Panel:  Lab Results  Component Value Date   NA 139 11/25/24   K 3.6 11/25/24   CO2 20 (L) 04/19/2016   GLUCOSE 102 (H) 11/25/2024   BUN 12 11/25/2024   CREATININE 0.90 11/25/24   CALCIUM  9.8 04/19/2016   GFRNONAA >60 04/19/2016   GFRAA >60 04/19/2016     ASSESSMENT  Michiel S Zeiss is a 28 y.o. male with a PMHx of asthma who presents to the ED after experiencing acute onset of left sided weakness and left sided severe throbbing headache today. LKN was initially reported as 1925, then revealed on further interview with patient to have been sometime between 9 AM and 1 PM today. Initial symptom was headache and mild left sided weakness. At about 7:20 PM he experienced abrupt worsening of his left sided weakness, so he texted a male friend about it. EMS was then called. BP was 140/100 and CBG was 90 with EMS. - Exam reveals several findings suggestive of functionality. After exam was completed,  family revealed that the patient has been under significant stress for the past 2 weeks due to severe illness in a close friend.  - CT head: No acute intracranial abnormality. Normal CT of the head without contrast .ASPECTS: 10. - CTA of head and neck with CTP: No large vessel occlusion or significant stenosis. No core infarct or penumbra on CT perfusion.  - MRI brain: No acute intracranial abnormality. No mass or abnormal enhancement. - EKG: Sinus rhythm - Labs with borderline low ionized Ca. CBC normal.  - Impression: Overall presentation is most consistent with complicated migraine. Lower on the DDx, but also possible, is headache followed by psychogenic pseudostroke in the setting of significant psychosocial stressor.   RECOMMENDATIONS  - Migraine cocktail - If symptoms resolve with migraine cocktail, can discharge home with outpatient neurology follow up.   I personally spent a total of 85 minutes in the care of the patient today including getting/reviewing separately obtained history, performing a medically appropriate exam/evaluation, counseling and educating, placing orders, referring and communicating with other health care professionals, documenting clinical information in the EHR, independently interpreting results, and coordinating care.  ______________________________________________________________________    Bonney SHARK, Jordanna Hendrie, MD [facility]

## 2024-11-05 NOTE — ED Provider Notes (Signed)
  Provider Note MRN:  [id number]  Arrival date & time: 11/06/24    ED Course and Medical Decision Making  Assumed care of patient at sign-out or upon transfer.  Suspect complex migraine, presenting with left-sided deficits, doing much better after migraine cocktail.  Awaiting MRI as recommended by neurology.  Anticipating discharge if normal.  1:42 AM update: MRI is normal.  Discharged home.  Procedures  Final Clinical Impressions(s) / ED Diagnoses     ICD-10-CM   1. Other migraine without status migrainosus, not intractable  G43.809 Ambulatory referral to Neurology    2. Left arm weakness  R29.898 Ambulatory referral to Neurology    3. Left leg weakness  R29.898 Ambulatory referral to Neurology    4. Sensory deficit present  R44.9 Ambulatory referral to Neurology      ED Discharge Orders          Ordered    Ambulatory referral to Neurology       Comments: An appointment is requested in approximately: 2 weeks   11/06/24 0142              Discharge Instructions      You were evaluated in the Emergency Department and after careful evaluation, we did not find any emergent condition requiring admission or further testing in the hospital.  Your exam/testing today is overall reassuring.  Symptoms likely due to a complex migraine.  Recommend follow-up with neurology to further discuss your symptoms.  Please return to the Emergency Department if you experience any worsening of your condition.   Thank you for allowing us  to be a part of your care.      Ozell HERO. Theadore, MD [facility] [email or web address]    Theadore Ozell HERO, MD 11/06/24 [phone]

## 2024-11-05 NOTE — ED Triage Notes (Signed)
 Pt BIB EMS as a code stroke with c/o headache x2 weeks, slurred speech, left side arm and leg weakness, and left foot drag. Per EMS, LSN is 19:25. Per pt, he did have fuzziness and tingling in his face around 11am this morning while he was working.

## 2024-11-05 NOTE — ED Provider Notes (Signed)
 [place] EMERGENCY DEPARTMENT AT [facility] Provider Note   CSN: [id number] Arrival date & time: 11/05/24  2014  An emergency department physician performed an initial assessment on this suspected stroke patient at 2014.  Patient presents with: Code Stroke   Brian [name] is a 28 y.o. male.  No pertinent history presenting with left-sided weakness, left-sided headache today.  History per EMS, patient.  History limited from patient's secondary to dysarthria.  Initially reported last known well 1925, however with history reported that developed left-sided headache, left-sided facial weakness, left upper and lower extremity numbness and tingling approximately 11 AM today, therefore this is the last known well.  Developed significant weakness reportedly of the left upper and lower extremity approximately 1920 today, texted a neighbor, who called his wife, who presented and appreciated the patient to have significant weakness.  Patient Dors is he initially noticed left-sided facial droop while looking himself in the mirror, then developed the left-sided weakness, and brought himself to the floor, then crawled over to a chair.  Denies head injury associated.  Denies any prior episodes of similar.  Denies nausea or vomiting.  Denies chest pain or shortness of breath.  Endorses that he has been having a headache for approximately 2 weeks, primarily left-sided, wrapping around from the front to the back of the left side of his head.   HPI     Prior to Admission medications   Medication Sig Start Date End Date Taking? Authorizing Provider  atorvastatin  (LIPITOR) 10 MG tablet Take 1 tablet (10 mg total) by mouth daily. 04/02/24     cetirizine (ZYRTEC) 10 MG tablet Take 10 mg by mouth daily.    [provider]  Cholecalciferol  (VITAMIN D3) 1.25 MG (50000 UT) CAPS Take 1 capsule (50,000 Units total) by mouth once a week. 06/28/24     ciprofloxacin  (CIPRO ) 500 MG tablet Take 1 tablet  (500 mg total) by mouth 2 (two) times daily. Patient not taking: Reported on 04/12/2017 04/20/16   Levora Riggs, PA-C  HYDROcodone -acetaminophen  (NORCO/VICODIN) 5-325 MG tablet Take 1 tablet by mouth every 4 (four) hours as needed. 04/12/17   Dean Clarity, MD  ibuprofen  (ADVIL ,MOTRIN ) 200 MG tablet Take 400 mg by mouth every 6 (six) hours as needed for mild pain or moderate pain.    [provider]  ibuprofen  (ADVIL ,MOTRIN ) 600 MG tablet Take 1 tablet (600 mg total) by mouth every 6 (six) hours as needed. 04/12/17   Dean Clarity, MD  metroNIDAZOLE  (FLAGYL ) 500 MG tablet Take 1 tablet (500 mg total) by mouth 2 (two) times daily. Patient not taking: Reported on 04/12/2017 04/20/16   Levora Riggs, PA-C  Multiple Vitamin (MULTIVITAMIN WITH MINERALS) TABS tablet Take 1 tablet by mouth daily.    [provider]  ondansetron  (ZOFRAN ) 4 MG tablet Take 1 tablet (4 mg total) by mouth every 6 (six) hours. Patient not taking: Reported on 04/12/2017 04/20/16   Levora Riggs, PA-C  phentermine  (ADIPEX-P ) 37.5 MG tablet Take 0.5-1 tablets (18.75-37.5 mg total) by mouth daily before breakfast. 09/22/24     rosuvastatin  (CRESTOR ) 10 MG tablet TAKE 1 TABLET BY MOUTH ONCE A DAY 05/12/20 07/01/21  Rexanne Ingle, MD  rosuvastatin  (CRESTOR ) 10 MG tablet Take 1 tablet (10 mg total) by mouth in the morning and at bedtime. 12/24/23     rosuvastatin  (CRESTOR ) 20 MG tablet Take 1 tablet (20 mg total) by mouth daily. 03/26/23     traMADol  (ULTRAM ) 50 MG tablet Take 1 tablet (50  mg total) by mouth every 6 (six) hours as needed. Patient not taking: Reported on 04/12/2017 04/20/16   Levora Riggs, PA-C    Allergies: Patient has no known allergies.    Review of Systems  Updated Vital Signs BP 117/80   Pulse 71   Temp 98.9 F (37.2 C) (Oral)   Resp 19   Ht 5' 6 (1.676 m)   Wt 77.2 kg   SpO2 100%   BMI 27.47 kg/m   Physical Exam Vitals and nursing note reviewed.  Constitutional:       General: He is in acute distress.     Appearance: He is well-developed. He is not ill-appearing.  HENT:     Head: Normocephalic and atraumatic.  Eyes:     Conjunctiva/sclera: Conjunctivae normal.  Cardiovascular:     Rate and Rhythm: Normal rate and regular rhythm.     Heart sounds: No murmur heard. Pulmonary:     Effort: Pulmonary effort is normal. No respiratory distress.     Breath sounds: Normal breath sounds.  Abdominal:     Palpations: Abdomen is soft.     Tenderness: There is no abdominal tenderness.  Musculoskeletal:        General: No swelling.     Cervical back: Neck supple.  Skin:    General: Skin is warm and dry.     Capillary Refill: Capillary refill takes less than 2 seconds.  Neurological:     Mental Status: He is alert and oriented to person, place, and time.     Cranial Nerves: Cranial nerve deficit present.     Sensory: Sensory deficit present.     Motor: Weakness present.     Coordination: Coordination abnormal.     Gait: Gait abnormal.     Comments: Dysarthria, difficulty with stuttering words.  No appreciable facial droop.  PERRL, EOMI.  0/5 strength left upper and lower extremity in comparison to the right side.  2+ radial, DP/PT pulses.  Significantly reduced sensation, unresponsive to painful stimulus in the left side of his body.  Reduced sensation to the left face in comparison to the right.  Reported unable to walk, dragging left leg.  Psychiatric:        Mood and Affect: Mood normal.     (all labs ordered are listed, but only abnormal results are displayed) Labs Reviewed  DIFFERENTIAL - Abnormal; Notable for the following components:      Result Value   Lymphs Abs 0.5 (*)    All other components within normal limits  COMPREHENSIVE METABOLIC PANEL WITH GFR - Abnormal; Notable for the following components:   Glucose, Bld 102 (*)    All other components within normal limits  I-STAT CHEM 8, ED - Abnormal; Notable for the following components:    Glucose, Bld 102 (*)    Calcium , Ion 1.07 (*)    TCO2 21 (*)    All other components within normal limits  PROTIME-INR  APTT  CBC  ETHANOL  CBG MONITORING, ED    EKG: EKG Interpretation Date/Time:  Friday November 05 2024 21:01:24 EST Ventricular Rate:  86 PR Interval:  147 QRS Duration:  88 QT Interval:  357 QTC Calculation: 427 R Axis:   75  Text Interpretation: Sinus rhythm No old tracing to compare Confirmed by Lenor Hollering [phone]) on 11/05/2024 9:44:36 PM  Radiology: CT ANGIO HEAD NECK W WO CM W PERF (CODE STROKE) Result Date: 11/05/2024 EXAM: CT ANGIOGRAPHY OF THE HEAD AND NECK CT PERFUSION BRAIN 11/05/2024  08:52:58 PM TECHNIQUE: Multidetector CT imaging of the head and neck was performed using the standard protocol during bolus administration of intravenous contrast. 3D postprocessing with multiplanar reconstructions and MIPs was performed to evaluate the vascular anatomy. Carotid stenosis measurements (when applicable) are obtained utilizing NASCET criteria, using the distal internal carotid diameter as the denominator. Cerebral perfusion analysis using computed tomography with contrast administration, including post-processing of parametric maps with determination of cerebral blood flow, cerebral blood volume, mean transit time and time-to-maximum. RADIATION DOSE REDUCTION: This exam was performed according to the departmental dose-optimization program which includes automated exposure control, adjustment of the mA and/or kV according to patient size and/or use of iterative reconstruction technique. CONTRAST: Without and with IV contrast COMPARISON: None Available. CLINICAL HISTORY: Neuro deficit, acute, stroke suspected Neuro deficit, acute, stroke suspected FINDINGS: BRAIN AND VENTRICLES: No acute intracranial hemorrhage. No mass effect. No midline shift. No extra-axial fluid collection. No evidence of acute infarct. No hydrocephalus. ORBITS: No acute abnormality. SINUSES AND  MASTOIDS: No acute abnormality. AORTIC ARCH AND ARCH VESSELS: No dissection or arterial injury. No significant stenosis of the brachiocephalic or subclavian arteries. CERVICAL CAROTID ARTERIES: No dissection, arterial injury, or hemodynamically significant stenosis by NASCET criteria. CERVICAL VERTEBRAL ARTERIES: No dissection, arterial injury, or significant stenosis. LUNGS AND MEDIASTINUM: Unremarkable. SOFT TISSUES: No acute abnormality. BONES: No acute abnormality. ANTERIOR CIRCULATION: No significant stenosis of the internal carotid arteries. No significant stenosis of the anterior cerebral arteries. Hypoplastic right A1 ACA. No significant stenosis of the middle cerebral arteries. No aneurysm. POSTERIOR CIRCULATION: No significant stenosis of the posterior cerebral arteries. Right fetal type PCA. No significant stenosis of the basilar artery. No significant stenosis of the vertebral arteries. Small nondominant left vertebral artery terminates as PICA, anatomic variant. No aneurysm. OTHER: No dural venous sinus thrombosis on this non-dedicated study. EXAM QUALITY: Exam quality is adequate with diagnostic perfusion maps. No significant motion artifact. Appropriate arterial inflow and venous outflow curves. CORE INFARCT (CBF<30% volume): 0 mL TOTAL HYPOPERFUSION (Tmax>6s volume): 0 mL Mismatch volume: 0 mL Mismatch ratio: not applicable Location: not applicable Findings discussed with Dr. Lindzen via telephone at 8:59 PM. IMPRESSION: 1. No large vessel occlusion or significant stenosis. 2. No core infarct or penumbra on CT perfusion. Electronically signed by: Gilmore Molt MD 11/05/2024 09:06 PM EST RP Workstation: [id number]   CT HEAD CODE STROKE WO CONTRAST Result Date: 11/05/2024 EXAM: CT HEAD WITHOUT 11/05/2024 08:27:31 PM TECHNIQUE: CT of the head was performed without the administration of intravenous contrast. Automated exposure control, iterative reconstruction, and/or weight based adjustment of the  mA/kV was utilized to reduce the radiation dose to as low as reasonably achievable. COMPARISON: None available. CLINICAL HISTORY: Neuro deficit, acute, stroke suspected. FINDINGS: BRAIN AND VENTRICLES: No acute intracranial hemorrhage. No mass effect or midline shift. No extra-axial fluid collection. No evidence of acute infarct. No hydrocephalus. ORBITS: No acute abnormality. SINUSES AND MASTOIDS: No acute abnormality. SOFT TISSUES AND SKULL: No acute skull fracture. No acute soft tissue abnormality. [place] Stroke Program Early CT Score (ASPECTS) ----- Ganglionic (caudate, IC, lentiform nucleus, insula, M1-M3): 7 Supraganglionic (M4-M6): 3 Total: 10 IMPRESSION: 1. No acute intracranial abnormality. Normal CT of the head without contrast . 2. ASPECTS: 10. 3. These results were communicated to Dr. Lindzen at 8:33 PM on 11/06/2024 by secure text page via the [facility] messaging system. Electronically signed by: Lonni Necessary MD 11/05/2024 08:35 PM EST RP Workstation: [id number]     Procedures   Medications Ordered in the ED  LORazepam  (  ATIVAN ) tablet 1 mg (1 mg Oral Not Given 11/06/24 0001)  sodium chloride  flush (NS) 0.9 % injection 3 mL (3 mLs Intravenous Given 11/05/24 2117)  iohexol  (OMNIPAQUE ) 350 MG/ML injection 100 mL (100 mLs Intravenous Contrast Given 11/05/24 2053)  prochlorperazine  (COMPAZINE ) injection 10 mg (10 mg Intravenous Given 11/05/24 2111)  diphenhydrAMINE  (BENADRYL ) injection 50 mg (50 mg Intravenous Given 11/05/24 2111)  lactated ringers  bolus 1,000 mL (0 mLs Intravenous Stopped 11/06/24 0029)    Clinical Course as of 11/06/24 0039  Fri Nov 05, 2024  2238 NIHSS imp from 8 to 3 w/ migraine cocktail [BS]  2300 Now moving the left side of his body, sensation has improved. Still having a slight pronator drift, HA has improved. Pending MRI.  [BS]    Clinical Course User Index [BS] Arlee Katz, MD                                 Medical Decision Making Amount and/or  Complexity of Data Reviewed Labs: ordered. Radiology: ordered.  Risk Prescription drug management.   Based on patient presentation, history, evaluation, high suspicion for complex migraine with associated neurologic deficits.  Imaging overall reassuring against evidence of LVO versus intracranial bleed versus intracranial mass versus anemia versus hypoglycemia versus hyponatremia versus significant electrolyte derangement versus AKI versus coagulopathy versus thrombocytopenia versus alcohol intoxication.  Provided migraine cocktail, with significant improvement in neurologic status, NIHSS improved from 8 to 3 with mild pronator drift now appreciated however moving left upper and lower extremities.  Patient evaluated by neurology as well, recommend MRI based on the significant presentation of the patient's symptoms, which the patient is still currently pending.  Will provide Ativan  so patient is able to tolerate MRI.  At this time, pending MRI, and improvement in neurologic status with migraine cocktail, and has remained overall hemodynamically stable while in the ED.  Overall plan for discharge home following MRI if overall reassuring.     Final diagnoses:  Other migraine without status migrainosus, not intractable  Left arm weakness  Left leg weakness  Sensory deficit present    ED Discharge Orders     None          Arlee Katz, MD 11/06/24 9960    Lenor Hollering, MD 11/15/24 1402

## 2024-11-06 ENCOUNTER — Emergency Department (HOSPITAL_COMMUNITY)

## 2024-11-06 MED ORDER — GADOBUTROL 1 MMOL/ML IV SOLN
8.0000 mL | Freq: Once | INTRAVENOUS | Status: AC | PRN
  Administered 2024-11-06: 8 mL via INTRAVENOUS

## 2024-11-06 NOTE — ED Notes (Signed)
 Patient is A&Ox4 upon discharge. Patient verbalized understanding of discharge instructions and follow-up care. Patient wheeled from ED .

## 2024-11-06 NOTE — Discharge Instructions (Signed)
 You were evaluated in the Emergency Department and after careful evaluation, we did not find any emergent condition requiring admission or further testing in the hospital.  Your exam/testing today is overall reassuring.  Symptoms likely due to a complex migraine.  Recommend follow-up with neurology to further discuss your symptoms.  Please return to the Emergency Department if you experience any worsening of your condition.   Thank you for allowing us  to be a part of your care.

## 2024-11-06 NOTE — Plan of Care (Signed)
 Numerous case reports dating as far back as the 90's have linked phentermine  to stroke in young individuals taking this weight loss medication. I have telephoned the patient to discuss this with him, in addition to other side effects of the medication as well as the benefits of the medication for weight loss. Overall, the risks are felt to outweigh the benefits given his stroke-like presentation yesterday. I have recommended that he stop this medication. He expressed understanding with the above.   Electronically signed: Dr. Jolonda Gomm

## 2024-11-06 NOTE — ED Notes (Signed)
 Patient transported to MRI

## 2024-12-15 ENCOUNTER — Other Ambulatory Visit (HOSPITAL_COMMUNITY): Payer: Self-pay

## 2024-12-15 MED ORDER — TOPIRAMATE 25 MG PO TABS
25.0000 mg | ORAL_TABLET | Freq: Every day | ORAL | 0 refills | Status: AC
  Filled 2024-12-15: qty 30, 30d supply, fill #0
